# Patient Record
Sex: Male | Born: 1985
Health system: Southern US, Community
[De-identification: ages and names within clinical notes are randomized; demographics above are authoritative.]

## PROBLEM LIST (undated history)

## (undated) DIAGNOSIS — S52501A Unspecified fracture of the lower end of right radius, initial encounter for closed fracture: Secondary | ICD-10-CM

## (undated) DIAGNOSIS — S32301A Unspecified fracture of right ilium, initial encounter for closed fracture: Secondary | ICD-10-CM

## (undated) DIAGNOSIS — S52601A Unspecified fracture of lower end of right ulna, initial encounter for closed fracture: Secondary | ICD-10-CM

## (undated) HISTORY — PX: NO PAST SURGERIES: SHX2092

---

## 2018-02-12 ENCOUNTER — Inpatient Hospital Stay (HOSPITAL_COMMUNITY)
Admission: EM | Admit: 2018-02-12 | Discharge: 2018-02-16 | DRG: 958 | Disposition: A | Discharge status: Home / Self Care | Payer: Self-pay | Attending: General Surgery | Admitting: General Surgery

## 2018-02-12 ENCOUNTER — Emergency Department (HOSPITAL_COMMUNITY): Payer: Self-pay

## 2018-02-12 ENCOUNTER — Encounter (HOSPITAL_COMMUNITY): Payer: Self-pay | Admitting: Oncology

## 2018-02-12 DIAGNOSIS — R740 Nonspecific elevation of levels of transaminase and lactic acid dehydrogenase [LDH]: Secondary | ICD-10-CM | POA: Diagnosis present

## 2018-02-12 DIAGNOSIS — S52611A Displaced fracture of right ulna styloid process, initial encounter for closed fracture: Secondary | ICD-10-CM | POA: Diagnosis present

## 2018-02-12 DIAGNOSIS — F17200 Nicotine dependence, unspecified, uncomplicated: Secondary | ICD-10-CM | POA: Diagnosis present

## 2018-02-12 DIAGNOSIS — S32301A Unspecified fracture of right ilium, initial encounter for closed fracture: Principal | ICD-10-CM | POA: Diagnosis present

## 2018-02-12 DIAGNOSIS — F17210 Nicotine dependence, cigarettes, uncomplicated: Secondary | ICD-10-CM | POA: Diagnosis present

## 2018-02-12 DIAGNOSIS — S52601A Unspecified fracture of lower end of right ulna, initial encounter for closed fracture: Secondary | ICD-10-CM

## 2018-02-12 DIAGNOSIS — T148XXA Other injury of unspecified body region, initial encounter: Secondary | ICD-10-CM

## 2018-02-12 DIAGNOSIS — J939 Pneumothorax, unspecified: Secondary | ICD-10-CM | POA: Diagnosis present

## 2018-02-12 DIAGNOSIS — Z419 Encounter for procedure for purposes other than remedying health state, unspecified: Secondary | ICD-10-CM

## 2018-02-12 DIAGNOSIS — Y902 Blood alcohol level of 40-59 mg/100 ml: Secondary | ICD-10-CM | POA: Diagnosis present

## 2018-02-12 DIAGNOSIS — S82831A Other fracture of upper and lower end of right fibula, initial encounter for closed fracture: Secondary | ICD-10-CM | POA: Diagnosis present

## 2018-02-12 DIAGNOSIS — F10129 Alcohol abuse with intoxication, unspecified: Secondary | ICD-10-CM | POA: Diagnosis present

## 2018-02-12 DIAGNOSIS — S270XXA Traumatic pneumothorax, initial encounter: Secondary | ICD-10-CM | POA: Diagnosis present

## 2018-02-12 DIAGNOSIS — R52 Pain, unspecified: Secondary | ICD-10-CM

## 2018-02-12 DIAGNOSIS — S329XXA Fracture of unspecified parts of lumbosacral spine and pelvis, initial encounter for closed fracture: Secondary | ICD-10-CM

## 2018-02-12 DIAGNOSIS — S52501A Unspecified fracture of the lower end of right radius, initial encounter for closed fracture: Secondary | ICD-10-CM | POA: Diagnosis present

## 2018-02-12 HISTORY — DX: Unspecified fracture of the lower end of right radius, initial encounter for closed fracture: S52.501A

## 2018-02-12 HISTORY — DX: Unspecified fracture of lower end of right ulna, initial encounter for closed fracture: S52.601A

## 2018-02-12 HISTORY — DX: Unspecified fracture of right ilium, initial encounter for closed fracture: S32.301A

## 2018-02-12 LAB — CBC
HCT: 40 % (ref 39.0–52.0)
HEMOGLOBIN: 13.5 g/dL (ref 13.0–17.0)
MCH: 31.3 pg (ref 26.0–34.0)
MCHC: 33.8 g/dL (ref 30.0–36.0)
MCV: 92.8 fL (ref 78.0–100.0)
Platelets: 332 10*3/uL (ref 150–400)
RBC: 4.31 MIL/uL (ref 4.22–5.81)
RDW: 13.1 % (ref 11.5–15.5)
WBC: 9.5 10*3/uL (ref 4.0–10.5)

## 2018-02-12 LAB — I-STAT CHEM 8, ED
BUN: 11 mg/dL (ref 6–20)
CALCIUM ION: 1.08 mmol/L — AB (ref 1.15–1.40)
CHLORIDE: 107 mmol/L (ref 101–111)
Creatinine, Ser: 1 mg/dL (ref 0.61–1.24)
GLUCOSE: 134 mg/dL — AB (ref 65–99)
HCT: 41 % (ref 39.0–52.0)
Hemoglobin: 13.9 g/dL (ref 13.0–17.0)
Potassium: 3.6 mmol/L (ref 3.5–5.1)
Sodium: 142 mmol/L (ref 135–145)
TCO2: 22 mmol/L (ref 22–32)

## 2018-02-12 LAB — COMPREHENSIVE METABOLIC PANEL
ALK PHOS: 78 U/L (ref 38–126)
ALT: 134 U/L — ABNORMAL HIGH (ref 17–63)
ANION GAP: 12 (ref 5–15)
AST: 359 U/L — ABNORMAL HIGH (ref 15–41)
Albumin: 3.6 g/dL (ref 3.5–5.0)
BILIRUBIN TOTAL: 0.6 mg/dL (ref 0.3–1.2)
BUN: 10 mg/dL (ref 6–20)
CALCIUM: 8.5 mg/dL — AB (ref 8.9–10.3)
CO2: 19 mmol/L — ABNORMAL LOW (ref 22–32)
Chloride: 107 mmol/L (ref 101–111)
Creatinine, Ser: 0.96 mg/dL (ref 0.61–1.24)
GFR calc Af Amer: >60 mL/min (ref >60)
GFR calc non Af Amer: >60 mL/min (ref >60)
GLUCOSE: 134 mg/dL — AB (ref 65–99)
Potassium: 3.6 mmol/L (ref 3.5–5.1)
Sodium: 138 mmol/L (ref 135–145)
TOTAL PROTEIN: 6.8 g/dL (ref 6.5–8.1)

## 2018-02-12 LAB — I-STAT CG4 LACTIC ACID, ED: Lactic Acid, Venous: 3.67 mmol/L (ref 0.5–1.9)

## 2018-02-12 LAB — ETHANOL: ALCOHOL ETHYL (B): 42 mg/dL — AB (ref <10)

## 2018-02-12 LAB — CDS SEROLOGY

## 2018-02-12 LAB — PROTIME-INR
INR: 1.05
PROTHROMBIN TIME: 13.6 s (ref 11.4–15.2)

## 2018-02-12 LAB — SAMPLE TO BLOOD BANK

## 2018-02-12 MED ORDER — SODIUM CHLORIDE 0.9 % IV BOLUS
1000.0000 mL | Freq: Once | INTRAVENOUS | Status: AC
  Administered 2018-02-12: 1000 mL via INTRAVENOUS

## 2018-02-12 MED ORDER — TETANUS-DIPHTH-ACELL PERTUSSIS 5-2.5-18.5 LF-MCG/0.5 IM SUSP
0.5000 mL | Freq: Once | INTRAMUSCULAR | Status: AC
  Administered 2018-02-12: 0.5 mL via INTRAMUSCULAR
  Filled 2018-02-12: qty 0.5

## 2018-02-12 MED ORDER — CEFAZOLIN SODIUM-DEXTROSE 1-4 GM/50ML-% IV SOLN
1.0000 g | Freq: Once | INTRAVENOUS | Status: AC
  Administered 2018-02-12: 1 g via INTRAVENOUS
  Filled 2018-02-12: qty 50

## 2018-02-12 MED ORDER — SODIUM CHLORIDE 0.9 % IV SOLN
INTRAVENOUS | Status: DC
  Administered 2018-02-13: via INTRAVENOUS

## 2018-02-12 MED ORDER — FENTANYL CITRATE (PF) 100 MCG/2ML IJ SOLN
75.0000 ug | Freq: Once | INTRAMUSCULAR | Status: AC
  Administered 2018-02-12: 75 ug via INTRAVENOUS
  Filled 2018-02-12: qty 2

## 2018-02-12 MED ORDER — IOHEXOL 300 MG/ML  SOLN
100.0000 mL | Freq: Once | INTRAMUSCULAR | Status: AC | PRN
  Administered 2018-02-12: 100 mL via INTRAVENOUS
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
## 2018-02-12 NOTE — ED Notes (Signed)
Patient transported to CT 

## 2018-02-13 ENCOUNTER — Emergency Department (HOSPITAL_COMMUNITY): Payer: Self-pay

## 2018-02-13 ENCOUNTER — Encounter (HOSPITAL_COMMUNITY): Payer: Self-pay | Admitting: General Practice

## 2018-02-13 ENCOUNTER — Other Ambulatory Visit: Payer: Self-pay

## 2018-02-13 ENCOUNTER — Inpatient Hospital Stay (HOSPITAL_COMMUNITY): Payer: Self-pay

## 2018-02-13 LAB — CBC
HCT: 36.5 % — ABNORMAL LOW (ref 39.0–52.0)
Hemoglobin: 12.2 g/dL — ABNORMAL LOW (ref 13.0–17.0)
MCH: 31.4 pg (ref 26.0–34.0)
MCHC: 33.4 g/dL (ref 30.0–36.0)
MCV: 93.8 fL (ref 78.0–100.0)
PLATELETS: 279 10*3/uL (ref 150–400)
RBC: 3.89 MIL/uL — AB (ref 4.22–5.81)
RDW: 13.3 % (ref 11.5–15.5)
WBC: 13.8 10*3/uL — AB (ref 4.0–10.5)

## 2018-02-13 LAB — URINALYSIS, ROUTINE W REFLEX MICROSCOPIC
Bilirubin Urine: NEGATIVE
Glucose, UA: NEGATIVE mg/dL
Ketones, ur: NEGATIVE mg/dL
Leukocytes, UA: NEGATIVE
Nitrite: NEGATIVE
Protein, ur: NEGATIVE mg/dL
Specific Gravity, Urine: 1.025 (ref 1.005–1.030)
pH: 5 (ref 5.0–8.0)

## 2018-02-13 LAB — COMPREHENSIVE METABOLIC PANEL
ALT: 121 U/L — AB (ref 17–63)
AST: 294 U/L — ABNORMAL HIGH (ref 15–41)
Albumin: 3.5 g/dL (ref 3.5–5.0)
Alkaline Phosphatase: 79 U/L (ref 38–126)
Anion gap: 7 (ref 5–15)
BILIRUBIN TOTAL: 0.4 mg/dL (ref 0.3–1.2)
BUN: 8 mg/dL (ref 6–20)
CO2: 24 mmol/L (ref 22–32)
Calcium: 8.3 mg/dL — ABNORMAL LOW (ref 8.9–10.3)
Chloride: 108 mmol/L (ref 101–111)
Creatinine, Ser: 0.84 mg/dL (ref 0.61–1.24)
GFR calc Af Amer: >60 mL/min (ref >60)
Glucose, Bld: 126 mg/dL — ABNORMAL HIGH (ref 65–99)
POTASSIUM: 4.1 mmol/L (ref 3.5–5.1)
SODIUM: 139 mmol/L (ref 135–145)
TOTAL PROTEIN: 6.6 g/dL (ref 6.5–8.1)

## 2018-02-13 MED ORDER — ONDANSETRON HCL 4 MG/2ML IJ SOLN
4.0000 mg | Freq: Four times a day (QID) | INTRAMUSCULAR | Status: DC | PRN
  Administered 2018-02-14: 4 mg via INTRAVENOUS

## 2018-02-13 MED ORDER — OXYCODONE HCL 5 MG PO TABS: 5.0000 mg | ORAL_TABLET | ORAL | Status: DC | PRN

## 2018-02-13 MED ORDER — MORPHINE SULFATE (PF) 4 MG/ML IV SOLN
4.0000 mg | Freq: Once | INTRAVENOUS | Status: AC
  Administered 2018-02-13: 4 mg via INTRAVENOUS
  Filled 2018-02-13: qty 1

## 2018-02-13 MED ORDER — ONDANSETRON 4 MG PO TBDP: 4.0000 mg | ORAL_TABLET | Freq: Four times a day (QID) | ORAL | Status: DC | PRN

## 2018-02-13 MED ORDER — MORPHINE SULFATE (PF) 4 MG/ML IV SOLN: 1.0000 mg | INTRAVENOUS | Status: DC | PRN

## 2018-02-13 MED ORDER — VITAMIN B-1 100 MG PO TABS
100.0000 mg | ORAL_TABLET | Freq: Every day | ORAL | Status: DC
  Administered 2018-02-13 – 2018-02-16 (×4): 100 mg via ORAL
  Filled 2018-02-13 (×4): qty 1

## 2018-02-13 MED ORDER — PROMETHAZINE HCL 25 MG/ML IJ SOLN: 12.5000 mg | Freq: Four times a day (QID) | INTRAMUSCULAR | Status: DC | PRN

## 2018-02-13 MED ORDER — ADULT MULTIVITAMIN W/MINERALS CH
1.0000 | ORAL_TABLET | Freq: Every day | ORAL | Status: DC
  Administered 2018-02-13 – 2018-02-16 (×4): 1 via ORAL
  Filled 2018-02-13 (×4): qty 1

## 2018-02-13 MED ORDER — DOCUSATE SODIUM 100 MG PO CAPS
100.0000 mg | ORAL_CAPSULE | Freq: Two times a day (BID) | ORAL | Status: DC
  Administered 2018-02-13 – 2018-02-14 (×2): 100 mg via ORAL
  Filled 2018-02-13 (×3): qty 1

## 2018-02-13 MED ORDER — KETAMINE HCL 10 MG/ML IJ SOLN
0.3000 mg/kg | Freq: Once | INTRAMUSCULAR | Status: AC
  Administered 2018-02-13: 19 mg via INTRAVENOUS
  Filled 2018-02-13: qty 1

## 2018-02-13 MED ORDER — HYDROMORPHONE HCL 2 MG/ML IJ SOLN
1.0000 mg | INTRAMUSCULAR | Status: DC | PRN
  Administered 2018-02-13 – 2018-02-14 (×4): 1 mg via INTRAVENOUS
  Filled 2018-02-13 (×4): qty 1

## 2018-02-13 MED ORDER — FOLIC ACID 1 MG PO TABS
1.0000 mg | ORAL_TABLET | Freq: Every day | ORAL | Status: DC
  Administered 2018-02-13 – 2018-02-16 (×4): 1 mg via ORAL
  Filled 2018-02-13 (×4): qty 1

## 2018-02-13 MED ORDER — LORAZEPAM 1 MG PO TABS: 1.0000 mg | ORAL_TABLET | Freq: Four times a day (QID) | ORAL | Status: AC | PRN

## 2018-02-13 MED ORDER — ACETAMINOPHEN 325 MG PO TABS: 650.0000 mg | ORAL_TABLET | ORAL | Status: DC | PRN

## 2018-02-13 MED ORDER — LORAZEPAM 2 MG/ML IJ SOLN: 1.0000 mg | Freq: Four times a day (QID) | INTRAMUSCULAR | Status: AC | PRN

## 2018-02-13 MED ORDER — OXYCODONE HCL 5 MG PO TABS
10.0000 mg | ORAL_TABLET | ORAL | Status: DC | PRN
  Administered 2018-02-13 – 2018-02-15 (×4): 10 mg via ORAL
  Filled 2018-02-13 (×4): qty 2

## 2018-02-13 MED ORDER — ENOXAPARIN SODIUM 40 MG/0.4ML ~~LOC~~ SOLN
40.0000 mg | SUBCUTANEOUS | Status: AC
  Administered 2018-02-13: 40 mg via SUBCUTANEOUS
  Filled 2018-02-13 (×2): qty 0.4

## 2018-02-13 MED ORDER — POTASSIUM CHLORIDE IN NACL 20-0.9 MEQ/L-% IV SOLN
INTRAVENOUS | Status: DC
  Administered 2018-02-13 – 2018-02-16 (×4): via INTRAVENOUS
  Filled 2018-02-13 (×7): qty 1000

## 2018-02-13 MED ORDER — THIAMINE HCL 100 MG/ML IJ SOLN
100.0000 mg | Freq: Every day | INTRAMUSCULAR | Status: DC
  Filled 2018-02-13: qty 2

## 2018-02-13 MED ORDER — LIDOCAINE HCL (PF) 1 % IJ SOLN
INTRAMUSCULAR | Status: AC
  Filled 2018-02-13: qty 30
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
## 2018-02-13 NOTE — Progress Notes (Signed)
Pt arrived via stretcher. Alert and oriented, acquainted to room with family member at bedside. Patient quietly resting. Will continue to monitor
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
## 2018-02-14 ENCOUNTER — Inpatient Hospital Stay (HOSPITAL_COMMUNITY): Payer: Self-pay

## 2018-02-14 ENCOUNTER — Inpatient Hospital Stay (HOSPITAL_COMMUNITY): Payer: Self-pay | Admitting: Anesthesiology

## 2018-02-14 ENCOUNTER — Encounter (HOSPITAL_COMMUNITY): Admission: EM | Disposition: A | Discharge status: Home / Self Care | Payer: Self-pay | Source: Home / Self Care

## 2018-02-14 HISTORY — PX: ORIF WRIST FRACTURE: SHX2133

## 2018-02-14 HISTORY — PX: ORIF PELVIC FRACTURE: SHX2128

## 2018-02-14 LAB — CBC
HCT: 38.4 % — ABNORMAL LOW (ref 39.0–52.0)
Hemoglobin: 12.6 g/dL — ABNORMAL LOW (ref 13.0–17.0)
MCH: 31.1 pg (ref 26.0–34.0)
MCHC: 32.8 g/dL (ref 30.0–36.0)
MCV: 94.8 fL (ref 78.0–100.0)
PLATELETS: 226 10*3/uL (ref 150–400)
RBC: 4.05 MIL/uL — ABNORMAL LOW (ref 4.22–5.81)
RDW: 13.2 % (ref 11.5–15.5)
WBC: 7.3 10*3/uL (ref 4.0–10.5)

## 2018-02-14 LAB — COMPREHENSIVE METABOLIC PANEL
ALK PHOS: 59 U/L (ref 38–126)
ALT: 71 U/L — AB (ref 17–63)
AST: 63 U/L — ABNORMAL HIGH (ref 15–41)
Albumin: 3.2 g/dL — ABNORMAL LOW (ref 3.5–5.0)
Anion gap: 5 (ref 5–15)
BUN: <5 mg/dL — ABNORMAL LOW (ref 6–20)
CALCIUM: 8.8 mg/dL — AB (ref 8.9–10.3)
CO2: 26 mmol/L (ref 22–32)
CREATININE: 0.69 mg/dL (ref 0.61–1.24)
Chloride: 103 mmol/L (ref 101–111)
GFR calc non Af Amer: >60 mL/min (ref >60)
Glucose, Bld: 96 mg/dL (ref 65–99)
Potassium: 4.1 mmol/L (ref 3.5–5.1)
Sodium: 134 mmol/L — ABNORMAL LOW (ref 135–145)
Total Bilirubin: 0.8 mg/dL (ref 0.3–1.2)
Total Protein: 6.5 g/dL (ref 6.5–8.1)

## 2018-02-14 LAB — SURGICAL PCR SCREEN
MRSA, PCR: NEGATIVE
STAPHYLOCOCCUS AUREUS: NEGATIVE

## 2018-02-14 LAB — TYPE AND SCREEN
ABO/RH(D): B NEG
Antibody Screen: NEGATIVE

## 2018-02-14 LAB — PROTIME-INR
INR: 0.97
PROTHROMBIN TIME: 12.7 s (ref 11.4–15.2)

## 2018-02-14 LAB — ABO/RH: ABO/RH(D): B NEG

## 2018-02-14 LAB — HIV ANTIBODY (ROUTINE TESTING W REFLEX): HIV SCREEN 4TH GENERATION: NONREACTIVE

## 2018-02-14 LAB — LACTIC ACID, PLASMA: LACTIC ACID, VENOUS: 0.9 mmol/L (ref 0.5–1.9)

## 2018-02-14 SURGERY — OPEN REDUCTION INTERNAL FIXATION (ORIF) PELVIC FRACTURE
Anesthesia: General | Site: Wrist | Laterality: Right

## 2018-02-14 MED ORDER — LIDOCAINE 2% (20 MG/ML) 5 ML SYRINGE
INTRAMUSCULAR | Status: AC
  Filled 2018-02-14: qty 5

## 2018-02-14 MED ORDER — SUGAMMADEX SODIUM 200 MG/2ML IV SOLN
INTRAVENOUS | Status: AC
  Filled 2018-02-14: qty 2

## 2018-02-14 MED ORDER — HYDROMORPHONE HCL 2 MG/ML IJ SOLN
0.2500 mg | INTRAMUSCULAR | Status: DC | PRN
  Administered 2018-02-14: 0.5 mg via INTRAVENOUS

## 2018-02-14 MED ORDER — SUGAMMADEX SODIUM 200 MG/2ML IV SOLN
INTRAVENOUS | Status: DC | PRN
  Administered 2018-02-14: 150 mg via INTRAVENOUS

## 2018-02-14 MED ORDER — LACTATED RINGERS IV SOLN
Freq: Once | INTRAVENOUS | Status: AC
  Administered 2018-02-14 (×3): via INTRAVENOUS

## 2018-02-14 MED ORDER — FENTANYL CITRATE (PF) 250 MCG/5ML IJ SOLN
INTRAMUSCULAR | Status: AC
  Filled 2018-02-14: qty 5

## 2018-02-14 MED ORDER — DEXAMETHASONE SODIUM PHOSPHATE 10 MG/ML IJ SOLN
INTRAMUSCULAR | Status: AC
  Filled 2018-02-14: qty 1

## 2018-02-14 MED ORDER — CEFAZOLIN SODIUM-DEXTROSE 2-4 GM/100ML-% IV SOLN
2.0000 g | INTRAVENOUS | Status: AC
  Administered 2018-02-14: 2 g via INTRAVENOUS
  Filled 2018-02-14: qty 100

## 2018-02-14 MED ORDER — MIDAZOLAM HCL 2 MG/2ML IJ SOLN
INTRAMUSCULAR | Status: AC
  Filled 2018-02-14: qty 2

## 2018-02-14 MED ORDER — PROPOFOL 10 MG/ML IV BOLUS
INTRAVENOUS | Status: DC | PRN
  Administered 2018-02-14: 200 mg via INTRAVENOUS

## 2018-02-14 MED ORDER — HYDROMORPHONE HCL 2 MG/ML IJ SOLN
INTRAMUSCULAR | Status: AC
  Filled 2018-02-14: qty 1

## 2018-02-14 MED ORDER — MIDAZOLAM HCL 2 MG/2ML IJ SOLN
INTRAMUSCULAR | Status: DC | PRN
  Administered 2018-02-14: 2 mg via INTRAVENOUS

## 2018-02-14 MED ORDER — ROCURONIUM BROMIDE 10 MG/ML (PF) SYRINGE
PREFILLED_SYRINGE | INTRAVENOUS | Status: AC
  Filled 2018-02-14: qty 5

## 2018-02-14 MED ORDER — 0.9 % SODIUM CHLORIDE (POUR BTL) OPTIME
TOPICAL | Status: DC | PRN
  Administered 2018-02-14: 1000 mL

## 2018-02-14 MED ORDER — ROCURONIUM BROMIDE 100 MG/10ML IV SOLN
INTRAVENOUS | Status: DC | PRN
  Administered 2018-02-14: 70 mg via INTRAVENOUS
  Administered 2018-02-14: 10 mg via INTRAVENOUS

## 2018-02-14 MED ORDER — MUPIROCIN 2 % EX OINT
1.0000 "application " | TOPICAL_OINTMENT | Freq: Two times a day (BID) | CUTANEOUS | Status: AC
  Administered 2018-02-14 – 2018-02-15 (×3): 1 via NASAL
  Filled 2018-02-14: qty 22

## 2018-02-14 MED ORDER — LIDOCAINE HCL (CARDIAC) PF 100 MG/5ML IV SOSY
PREFILLED_SYRINGE | INTRAVENOUS | Status: DC | PRN
  Administered 2018-02-14: 100 mg via INTRATRACHEAL

## 2018-02-14 MED ORDER — FENTANYL CITRATE (PF) 250 MCG/5ML IJ SOLN
INTRAMUSCULAR | Status: DC | PRN
  Administered 2018-02-14: 100 ug via INTRAVENOUS
  Administered 2018-02-14: 50 ug via INTRAVENOUS
  Administered 2018-02-14 (×2): 100 ug via INTRAVENOUS
  Administered 2018-02-14 (×3): 50 ug via INTRAVENOUS

## 2018-02-14 MED ORDER — DEXAMETHASONE SODIUM PHOSPHATE 10 MG/ML IJ SOLN
INTRAMUSCULAR | Status: DC | PRN
  Administered 2018-02-14: 10 mg via INTRAVENOUS

## 2018-02-14 MED ORDER — PROPOFOL 10 MG/ML IV BOLUS
INTRAVENOUS | Status: AC
  Filled 2018-02-14: qty 20

## 2018-02-14 MED ORDER — ONDANSETRON HCL 4 MG/2ML IJ SOLN
INTRAMUSCULAR | Status: AC
  Filled 2018-02-14: qty 2

## 2018-02-14 SURGICAL SUPPLY — 105 items
BANDAGE ACE 3X5.8 VEL STRL LF (GAUZE/BANDAGES/DRESSINGS) ×4 IMPLANT
BANDAGE ACE 4X5 VEL STRL LF (GAUZE/BANDAGES/DRESSINGS) ×4 IMPLANT
BENZOIN TINCTURE PRP APPL 2/3 (GAUZE/BANDAGES/DRESSINGS) ×8 IMPLANT
BIT DRILL 2.2 SS TIBIAL (BIT) ×4 IMPLANT
BLADE CLIPPER SURG (BLADE) ×4 IMPLANT
BNDG ELASTIC 2X5.8 VLCR STR LF (GAUZE/BANDAGES/DRESSINGS) ×4 IMPLANT
BNDG ESMARK 4X9 LF (GAUZE/BANDAGES/DRESSINGS) ×4 IMPLANT
BNDG GAUZE ELAST 4 BULKY (GAUZE/BANDAGES/DRESSINGS) ×4 IMPLANT
BRUSH SCRUB SURG 4.25 DISP (MISCELLANEOUS) ×8 IMPLANT
CAP PIN PROTECTOR ORTHO WHT (CAP) ×4 IMPLANT
CLOSURE WOUND 1/2 X4 (GAUZE/BANDAGES/DRESSINGS) ×1
CORD BIPOLAR FORCEPS 12FT (ELECTRODE) ×4 IMPLANT
COVER SURGICAL LIGHT HANDLE (MISCELLANEOUS) ×8 IMPLANT
DECANTER SPIKE VIAL GLASS SM (MISCELLANEOUS) IMPLANT
DRAIN CHANNEL 15F RND FF W/TCR (WOUND CARE) IMPLANT
DRAPE C-ARM 42X72 X-RAY (DRAPES) ×4 IMPLANT
DRAPE C-ARMOR (DRAPES) ×4 IMPLANT
DRAPE LAPAROTOMY TRNSV 102X78 (DRAPE) ×4 IMPLANT
DRAPE OEC MINIVIEW 54X84 (DRAPES) ×4 IMPLANT
DRAPE ORTHO SPLIT 77X108 STRL (DRAPES) ×2
DRAPE SURG 17X23 STRL (DRAPES) ×4 IMPLANT
DRAPE SURG ORHT 6 SPLT 77X108 (DRAPES) ×2 IMPLANT
DRAPE U-SHAPE 47X51 STRL (DRAPES) ×4 IMPLANT
DRSG ADAPTIC 3X8 NADH LF (GAUZE/BANDAGES/DRESSINGS) ×4 IMPLANT
DRSG EMULSION OIL 3X3 NADH (GAUZE/BANDAGES/DRESSINGS) ×4 IMPLANT
DRSG MEPILEX BORDER 4X8 (GAUZE/BANDAGES/DRESSINGS) ×4 IMPLANT
ELECT BLADE 4.0 EZ CLEAN MEGAD (MISCELLANEOUS) ×4
ELECT REM PT RETURN 9FT ADLT (ELECTROSURGICAL) ×4
ELECTRODE BLDE 4.0 EZ CLN MEGD (MISCELLANEOUS) ×2 IMPLANT
ELECTRODE REM PT RTRN 9FT ADLT (ELECTROSURGICAL) ×2 IMPLANT
EVACUATOR SILICONE 100CC (DRAIN) IMPLANT
GAUZE SPONGE 4X4 12PLY STRL (GAUZE/BANDAGES/DRESSINGS) ×4 IMPLANT
GLOVE BIO SURGEON STRL SZ7.5 (GLOVE) ×4 IMPLANT
GLOVE BIO SURGEON STRL SZ8 (GLOVE) ×4 IMPLANT
GLOVE BIOGEL PI IND STRL 6.5 (GLOVE) ×2 IMPLANT
GLOVE BIOGEL PI IND STRL 7.5 (GLOVE) ×2 IMPLANT
GLOVE BIOGEL PI IND STRL 8 (GLOVE) ×2 IMPLANT
GLOVE BIOGEL PI INDICATOR 6.5 (GLOVE) ×2
GLOVE BIOGEL PI INDICATOR 7.5 (GLOVE) ×2
GLOVE BIOGEL PI INDICATOR 8 (GLOVE) ×2
GLOVE SURG SS PI 6.0 STRL IVOR (GLOVE) ×4 IMPLANT
GOWN STRL REUS W/ TWL LRG LVL3 (GOWN DISPOSABLE) ×4 IMPLANT
GOWN STRL REUS W/ TWL XL LVL3 (GOWN DISPOSABLE) ×2 IMPLANT
GOWN STRL REUS W/TWL LRG LVL3 (GOWN DISPOSABLE) ×4
GOWN STRL REUS W/TWL XL LVL3 (GOWN DISPOSABLE) ×2
K-WIRE 1.6 (WIRE) ×10
K-WIRE DBL TROCAR .045X4 ×4 IMPLANT
K-WIRE FX5X1.6XNS BN SS (WIRE) ×10
KIT BASIN OR (CUSTOM PROCEDURE TRAY) ×4 IMPLANT
KIT TURNOVER KIT B (KITS) ×4 IMPLANT
KWIRE DBL TROCAR .045X4 ×2 IMPLANT
KWIRE FX5X1.6XNS BN SS (WIRE) ×10 IMPLANT
MANIFOLD NEPTUNE II (INSTRUMENTS) ×4 IMPLANT
NEEDLE HYPO 25GX1X1/2 BEV (NEEDLE) IMPLANT
NS IRRIG 1000ML POUR BTL (IV SOLUTION) ×8 IMPLANT
PACK ORTHO EXTREMITY (CUSTOM PROCEDURE TRAY) ×4 IMPLANT
PACK TOTAL JOINT (CUSTOM PROCEDURE TRAY) ×4 IMPLANT
PAD ARMBOARD 7.5X6 YLW CONV (MISCELLANEOUS) ×8 IMPLANT
PAD CAST 3X4 CTTN HI CHSV (CAST SUPPLIES) ×2 IMPLANT
PADDING CAST COTTON 3X4 STRL (CAST SUPPLIES) ×2
PADDING UNDERCAST 2 STRL (CAST SUPPLIES) ×2
PADDING UNDERCAST 2X4 STRL (CAST SUPPLIES) ×2 IMPLANT
PEG LOCKING SMOOTH 2.2X20 (Screw) ×8 IMPLANT
PEG LOCKING SMOOTH 2.2X22 (Screw) ×12 IMPLANT
PEG LOCKING SMOOTH 2.2X24 (Peg) ×4 IMPLANT
PLATE ACET STRT 94.5M 8H (Plate) ×4 IMPLANT
PLATE DVR CROSSLOCK STD RT (Plate) ×4 IMPLANT
PLATE PROXIMAL ACULOC 2 WD RT (Plate) ×4 IMPLANT
SCREW CORTEX ST MATTA 3.5X18MM (Screw) ×4 IMPLANT
SCREW CORTEX ST MATTA 3.5X20 (Screw) ×4 IMPLANT
SCREW CORTEX ST MATTA 3.5X24 (Screw) ×4 IMPLANT
SCREW CORTEX ST MATTA 3.5X30MM (Screw) ×4 IMPLANT
SCREW CORTEX ST MATTA 3.5X50MM (Screw) ×4 IMPLANT
SCREW LOCK 16X2.7X 3 LD TPR (Screw) ×4 IMPLANT
SCREW LOCKING 2.7X15MM (Screw) ×4 IMPLANT
SCREW LOCKING 2.7X16 (Screw) ×4 IMPLANT
SCREW MULTI DIRECTIONAL 2.7X22 (Screw) ×4 IMPLANT
SCREW NLOCK 24X2.7 3 LD (Screw) ×2 IMPLANT
SCREW NONLOCK 2.7X24 (Screw) ×2 IMPLANT
SPLINT PLASTER CAST XFAST 4X15 (CAST SUPPLIES) ×2 IMPLANT
SPLINT PLASTER XTRA FAST SET 4 (CAST SUPPLIES) ×2
SPONGE LAP 18X18 X RAY DECT (DISPOSABLE) IMPLANT
STAPLER VISISTAT 35W (STAPLE) ×4 IMPLANT
STRIP CLOSURE SKIN 1/2X4 (GAUZE/BANDAGES/DRESSINGS) ×3 IMPLANT
SUCTION FRAZIER HANDLE 10FR (MISCELLANEOUS) ×2
SUCTION TUBE FRAZIER 10FR DISP (MISCELLANEOUS) ×2 IMPLANT
SUT ETHILON 3 0 PS 1 (SUTURE) ×4 IMPLANT
SUT MNCRL AB 3-0 PS2 18 (SUTURE) ×4 IMPLANT
SUT PROLENE 0 CT (SUTURE) IMPLANT
SUT VIC AB 0 CT1 27 (SUTURE) ×4
SUT VIC AB 0 CT1 27XBRD ANBCTR (SUTURE) ×4 IMPLANT
SUT VIC AB 1 CT1 18XCR BRD 8 (SUTURE) ×4 IMPLANT
SUT VIC AB 1 CT1 8-18 (SUTURE) ×4
SUT VIC AB 2-0 CT1 27 (SUTURE) ×6
SUT VIC AB 2-0 CT1 TAPERPNT 27 (SUTURE) ×6 IMPLANT
SUT VIC AB 2-0 CT3 27 (SUTURE) IMPLANT
SYR CONTROL 10ML LL (SYRINGE) IMPLANT
TOWEL OR 17X24 6PK STRL BLUE (TOWEL DISPOSABLE) ×4 IMPLANT
TOWEL OR 17X26 10 PK STRL BLUE (TOWEL DISPOSABLE) ×8 IMPLANT
TRAY FOLEY MTR SLVR 16FR STAT (SET/KITS/TRAYS/PACK) IMPLANT
TUBE CONNECTING 12'X1/4 (SUCTIONS) ×1
TUBE CONNECTING 12X1/4 (SUCTIONS) ×3 IMPLANT
UNDERPAD 30X30 (UNDERPADS AND DIAPERS) ×4 IMPLANT
WATER STERILE IRR 1000ML POUR (IV SOLUTION) ×16 IMPLANT
k wire 1.6 mm ×20 IMPLANT
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
## 2018-02-14 NOTE — Transfer of Care (Signed)
Immediate Anesthesia Transfer of Care Note  Patient: Gregory Patterson  Procedure(s) Performed: OPEN REDUCTION INTERNAL FIXATION (ORIF) PELVIC FRACTURE (Right Pelvis) OPEN REDUCTION INTERNAL FIXATION (ORIF) DISTAL RADIUS FRACTURE (Right Wrist)  Patient Location: PACU  Anesthesia Type:General  Level of Consciousness: sedated and patient cooperative  Airway & Oxygen Therapy: Patient connected to face mask oxygen  Post-op Assessment: Report given to RN and Post -op Vital signs reviewed and stable  Post vital signs: Reviewed and stable  Last Vitals:  Vitals Value Taken Time  BP 170/100 02/14/2018 10:32 PM  Temp    Pulse 103 02/14/2018 10:38 PM  Resp 16 02/14/2018 10:38 PM  SpO2 99 % 02/14/2018 10:38 PM  Vitals shown include unvalidated device data.  Last Pain:  Vitals:   02/14/18 2232  TempSrc:   PainSc: (P) Asleep      Patients Stated Pain Goal: 2 (02/14/18 0981)  Complications: No apparent anesthesia complications
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
## 2018-02-14 NOTE — Anesthesia Preprocedure Evaluation (Addendum)
Anesthesia Evaluation  Patient identified by MRN, date of birth, ID band Patient awake    Reviewed: Allergy & Precautions, H&P , NPO status , Patient's Chart, lab work & pertinent test results  Airway Mallampati: II  TM Distance: >3 FB Neck ROM: Full    Dental no notable dental hx. (+) Teeth Intact, Dental Advisory Given   Pulmonary Current Smoker,    Pulmonary exam normal breath sounds clear to auscultation       Cardiovascular Exercise Tolerance: Good negative cardio ROS   Rhythm:Regular Rate:Normal     Neuro/Psych negative neurological ROS  negative psych ROS   GI/Hepatic negative GI ROS, (+)     substance abuse  marijuana use,   Endo/Other  negative endocrine ROS  Renal/GU negative Renal ROS  negative genitourinary   Musculoskeletal   Abdominal   Peds  Hematology negative hematology ROS (+)   Anesthesia Other Findings   Reproductive/Obstetrics negative OB ROS                            Anesthesia Physical Anesthesia Plan  ASA: II  Anesthesia Plan: General   Post-op Pain Management:    Induction: Intravenous  PONV Risk Score and Plan: 2 and Ondansetron, Dexamethasone and Midazolam  Airway Management Planned: Oral ETT  Additional Equipment:   Intra-op Plan:   Post-operative Plan: Extubation in OR  Informed Consent: I have reviewed the patients History and Physical, chart, labs and discussed the procedure including the risks, benefits and alternatives for the proposed anesthesia with the patient or authorized representative who has indicated his/her understanding and acceptance.   Dental advisory given  Plan Discussed with: CRNA  Anesthesia Plan Comments:        Anesthesia Quick Evaluation
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
## 2018-02-14 NOTE — Anesthesia Procedure Notes (Signed)
Procedure Name: Intubation Date/Time: 02/14/2018 7:02 PM Performed by: Molli Hazard, CRNA Pre-anesthesia Checklist: Patient identified, Emergency Drugs available, Suction available and Patient being monitored Patient Re-evaluated:Patient Re-evaluated prior to induction Oxygen Delivery Method: Circle system utilized Preoxygenation: Pre-oxygenation with 100% oxygen Induction Type: IV induction Ventilation: Mask ventilation without difficulty Laryngoscope Size: Miller and 2 Grade View: Grade I Tube type: Oral Tube size: 7.5 mm Number of attempts: 1 Airway Equipment and Method: Stylet Placement Confirmation: ETT inserted through vocal cords under direct vision,  positive ETCO2 and breath sounds checked- equal and bilateral Secured at: 21 cm Tube secured with: Tape Dental Injury: Teeth and Oropharynx as per pre-operative assessment

## 2018-02-14 NOTE — Progress Notes (Signed)
To O.R. via bed
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
## 2018-02-15 ENCOUNTER — Encounter (HOSPITAL_COMMUNITY): Payer: Self-pay | Admitting: Orthopedic Surgery

## 2018-02-15 LAB — BASIC METABOLIC PANEL
Anion gap: 8 (ref 5–15)
CALCIUM: 8.8 mg/dL — AB (ref 8.9–10.3)
CO2: 27 mmol/L (ref 22–32)
CREATININE: 0.77 mg/dL (ref 0.61–1.24)
Chloride: 100 mmol/L — ABNORMAL LOW (ref 101–111)
GFR calc Af Amer: >60 mL/min (ref >60)
GFR calc non Af Amer: >60 mL/min (ref >60)
GLUCOSE: 145 mg/dL — AB (ref 65–99)
Potassium: 4.2 mmol/L (ref 3.5–5.1)
Sodium: 135 mmol/L (ref 135–145)

## 2018-02-15 LAB — CBC
HCT: 36.2 % — ABNORMAL LOW (ref 39.0–52.0)
Hemoglobin: 12.3 g/dL — ABNORMAL LOW (ref 13.0–17.0)
MCH: 31.6 pg (ref 26.0–34.0)
MCHC: 34 g/dL (ref 30.0–36.0)
MCV: 93.1 fL (ref 78.0–100.0)
PLATELETS: 279 10*3/uL (ref 150–400)
RBC: 3.89 MIL/uL — ABNORMAL LOW (ref 4.22–5.81)
RDW: 12.9 % (ref 11.5–15.5)
WBC: 13.4 10*3/uL — ABNORMAL HIGH (ref 4.0–10.5)

## 2018-02-15 MED ORDER — POLYETHYLENE GLYCOL 3350 17 G PO PACK
17.0000 g | PACK | Freq: Every day | ORAL | Status: DC
  Administered 2018-02-15 – 2018-02-16 (×2): 17 g via ORAL
  Filled 2018-02-15 (×2): qty 1

## 2018-02-15 MED ORDER — ACETAMINOPHEN 325 MG PO TABS
650.0000 mg | ORAL_TABLET | ORAL | Status: DC
  Administered 2018-02-15 – 2018-02-16 (×9): 650 mg via ORAL
  Filled 2018-02-15 (×10): qty 2

## 2018-02-15 MED ORDER — ENOXAPARIN SODIUM 40 MG/0.4ML ~~LOC~~ SOLN
40.0000 mg | SUBCUTANEOUS | Status: DC
  Administered 2018-02-15 – 2018-02-16 (×2): 40 mg via SUBCUTANEOUS
  Filled 2018-02-15 (×2): qty 0.4

## 2018-02-15 MED ORDER — ONDANSETRON HCL 4 MG/2ML IJ SOLN: 4.0000 mg | Freq: Four times a day (QID) | INTRAMUSCULAR | Status: DC | PRN

## 2018-02-15 MED ORDER — ONDANSETRON HCL 4 MG PO TABS: 4.0000 mg | ORAL_TABLET | Freq: Four times a day (QID) | ORAL | Status: DC | PRN

## 2018-02-15 MED ORDER — HYDROMORPHONE HCL 2 MG/ML IJ SOLN
1.0000 mg | INTRAMUSCULAR | Status: DC | PRN
  Administered 2018-02-15: 2 mg via INTRAVENOUS
  Administered 2018-02-15: 1 mg via INTRAVENOUS
  Administered 2018-02-15 – 2018-02-16 (×2): 2 mg via INTRAVENOUS
  Filled 2018-02-15 (×4): qty 1

## 2018-02-15 MED ORDER — CEFAZOLIN SODIUM-DEXTROSE 1-4 GM/50ML-% IV SOLN
1.0000 g | Freq: Four times a day (QID) | INTRAVENOUS | Status: AC
  Administered 2018-02-15 (×3): 1 g via INTRAVENOUS
  Filled 2018-02-15 (×3): qty 50

## 2018-02-15 MED ORDER — METOCLOPRAMIDE HCL 5 MG PO TABS: 5.0000 mg | ORAL_TABLET | Freq: Three times a day (TID) | ORAL | Status: DC | PRN

## 2018-02-15 MED ORDER — DOCUSATE SODIUM 100 MG PO CAPS
100.0000 mg | ORAL_CAPSULE | Freq: Two times a day (BID) | ORAL | Status: DC
  Administered 2018-02-15 – 2018-02-16 (×4): 100 mg via ORAL
  Filled 2018-02-15 (×4): qty 1

## 2018-02-15 MED ORDER — OXYCODONE HCL 5 MG PO TABS
5.0000 mg | ORAL_TABLET | ORAL | Status: DC | PRN
  Administered 2018-02-16: 10 mg via ORAL
  Administered 2018-02-16: 5 mg via ORAL
  Filled 2018-02-15 (×2): qty 2
  Filled 2018-02-15: qty 1

## 2018-02-15 MED ORDER — METHOCARBAMOL 500 MG PO TABS
1000.0000 mg | ORAL_TABLET | Freq: Four times a day (QID) | ORAL | Status: DC
  Administered 2018-02-15 – 2018-02-16 (×7): 1000 mg via ORAL
  Filled 2018-02-15 (×7): qty 2

## 2018-02-15 MED ORDER — METOCLOPRAMIDE HCL 5 MG/ML IJ SOLN: 5.0000 mg | Freq: Three times a day (TID) | INTRAMUSCULAR | Status: DC | PRN
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
## 2018-02-15 NOTE — Anesthesia Postprocedure Evaluation (Signed)
Anesthesia Post Note  Patient: Gregory Patterson  Procedure(s) Performed: OPEN REDUCTION INTERNAL FIXATION (ORIF) PELVIC FRACTURE (Right Pelvis) OPEN REDUCTION INTERNAL FIXATION (ORIF) DISTAL RADIUS FRACTURE (Right Wrist)     Patient location during evaluation: PACU Anesthesia Type: General Level of consciousness: awake and alert Pain management: pain level controlled Vital Signs Assessment: post-procedure vital signs reviewed and stable Respiratory status: spontaneous breathing, nonlabored ventilation and respiratory function stable Cardiovascular status: blood pressure returned to baseline and stable Postop Assessment: no apparent nausea or vomiting Anesthetic complications: no    Last Vitals:  Vitals:   02/14/18 2356 02/15/18 0557  BP: (!) 162/98 138/84  Pulse: 71 (!) 57  Resp: 14 14  Temp: 37.1 C 36.8 C  SpO2: 100% 100%    Last Pain:  Vitals:   02/15/18 0557  TempSrc: Oral  PainSc:                  Beryle Lathe

## 2018-02-15 NOTE — Progress Notes (Signed)
Orthopaedic Trauma Service (OTS)  1 Day Post-Op Procedure(s) (LRB): OPEN REDUCTION INTERNAL FIXATION (ORIF) PELVIC FRACTURE (Right) OPEN REDUCTION INTERNAL FIXATION (ORIF) DISTAL RADIUS FRACTURE (Right)  Subjective: Patient reports pain as moderate with right wrist and improved with pelvis.    Objective: Current Vitals Blood pressure 138/84, pulse (!) 57, temperature 98.3 F (36.8 C), temperature source Oral, resp. rate 14, height  (1.702 m), weight 64.7 kg (142 lb 10.2 oz), SpO2 100 %. Vital signs in last 24 hours: Temp:  [97.3 F (36.3 C)-98.8 F (37.1 C)] 98.3 F (36.8 C) (05/29 0557) Pulse Rate:  [57-108] 57 (05/29 0557) Resp:  [11-16] 14 (05/29 0557) BP: (138-171)/(84-106) 138/84 (05/29 0557) SpO2:  [99 %-100 %] 100 % (05/29 0557)  Intake/Output from previous day: 05/28 0701 - 05/29 0700 In: 3265 [P.O.:240; I.V.:2825; IV Piggyback:100] Out: 4035 [Urine:3725; Blood:300; Chest Tube:10]  LABS Recent Labs    02/12/18 2220 02/12/18 2230 02/13/18 0415 02/14/18 1038 02/15/18 0020  HGB 13.5 13.9 12.2* 12.6* 12.3*   Recent Labs    02/14/18 1038 02/15/18 0020  WBC 7.3 13.4*  RBC 4.05* 3.89*  HCT 38.4* 36.2*  PLT 226 279   Recent Labs    02/14/18 1038 02/15/18 0020  NA 134* 135  K 4.1 4.2  CL 103 100*  CO2 26 27  BUN <5* <5*  CREATININE 0.69 0.77  GLUCOSE 96 145*  CALCIUM 8.8* 8.8*   Recent Labs    02/12/18 2220 02/14/18 1038  INR 1.05 0.97     Physical Exam RUE Splint in place  Sens  Ax/R/M/U intact  Mot   Ax/ R/ PIN/ M/ AIN/ U intact  Brisk CR RLE  Dressing intact, clean, dry  Edema/ swelling controlled  Sens: DPN, SPN, TN intact but mild paresthesia with LFCN  Motor: EHL, FHL, and lessor toe ext and flex all intact grossly  Brisk cap refill, warm to touch  Assessment/Plan: 1 Day Post-Op Procedure(s) (LRB): OPEN REDUCTION INTERNAL FIXATION (ORIF) PELVIC FRACTURE (Right) OPEN REDUCTION INTERNAL FIXATION (ORIF) DISTAL RADIUS FRACTURE  (Right) 1. PT/OT (will try to avoid platform on right but unable to mobilize will consider platform and WB thru elbow) 2. DVT proph Other (comment) per trauma service 3. F/u 8-14 days  Myrene Galas, MD [facility] [phone] (p)

## 2018-02-15 NOTE — Progress Notes (Signed)
Received pt from PACU. Alert and oriented x4. S/P ORIF pelvic fx, ORIF distal radius fx and closed reduction and pinning of ulnar styloid. Patient c/o pain to right arm and right hip, PRN dilaudid  given. Noted with CDI compression wrap to RUE, able to wiggle fingers, good cap refill. Also with CDI hydrocolloid dsg to right pelvic area. Chest tube to right chest hooked to suction with no air leak. VSS.Call light within reach.
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
## 2018-02-16 ENCOUNTER — Inpatient Hospital Stay (HOSPITAL_COMMUNITY): Payer: Self-pay

## 2018-02-16 ENCOUNTER — Encounter (HOSPITAL_COMMUNITY): Payer: Self-pay | Admitting: Orthopedic Surgery

## 2018-02-16 DIAGNOSIS — S32301A Unspecified fracture of right ilium, initial encounter for closed fracture: Secondary | ICD-10-CM

## 2018-02-16 DIAGNOSIS — S82831A Other fracture of upper and lower end of right fibula, initial encounter for closed fracture: Secondary | ICD-10-CM | POA: Diagnosis present

## 2018-02-16 DIAGNOSIS — F17200 Nicotine dependence, unspecified, uncomplicated: Secondary | ICD-10-CM | POA: Diagnosis present

## 2018-02-16 DIAGNOSIS — S52501A Unspecified fracture of the lower end of right radius, initial encounter for closed fracture: Secondary | ICD-10-CM

## 2018-02-16 DIAGNOSIS — J939 Pneumothorax, unspecified: Secondary | ICD-10-CM | POA: Diagnosis present

## 2018-02-16 DIAGNOSIS — S52601A Unspecified fracture of lower end of right ulna, initial encounter for closed fracture: Secondary | ICD-10-CM

## 2018-02-16 HISTORY — DX: Unspecified fracture of the lower end of right radius, initial encounter for closed fracture: S52.501A

## 2018-02-16 HISTORY — DX: Unspecified fracture of the lower end of right radius, initial encounter for closed fracture: S52.601A

## 2018-02-16 HISTORY — DX: Unspecified fracture of right ilium, initial encounter for closed fracture: S32.301A

## 2018-02-16 LAB — CBC
HCT: 31.9 % — ABNORMAL LOW (ref 39.0–52.0)
Hemoglobin: 10.5 g/dL — ABNORMAL LOW (ref 13.0–17.0)
MCH: 31.3 pg (ref 26.0–34.0)
MCHC: 32.9 g/dL (ref 30.0–36.0)
MCV: 94.9 fL (ref 78.0–100.0)
PLATELETS: 255 10*3/uL (ref 150–400)
RBC: 3.36 MIL/uL — ABNORMAL LOW (ref 4.22–5.81)
RDW: 12.8 % (ref 11.5–15.5)
WBC: 7.8 10*3/uL (ref 4.0–10.5)

## 2018-02-16 LAB — COMPREHENSIVE METABOLIC PANEL
ALT: 42 U/L (ref 17–63)
AST: 54 U/L — AB (ref 15–41)
Albumin: 3.1 g/dL — ABNORMAL LOW (ref 3.5–5.0)
Alkaline Phosphatase: 52 U/L (ref 38–126)
Anion gap: 10 (ref 5–15)
BUN: 6 mg/dL (ref 6–20)
CHLORIDE: 101 mmol/L (ref 101–111)
CO2: 25 mmol/L (ref 22–32)
CREATININE: 0.71 mg/dL (ref 0.61–1.24)
Calcium: 8.8 mg/dL — ABNORMAL LOW (ref 8.9–10.3)
GFR calc Af Amer: >60 mL/min (ref >60)
Glucose, Bld: 100 mg/dL — ABNORMAL HIGH (ref 65–99)
Potassium: 4.5 mmol/L (ref 3.5–5.1)
SODIUM: 136 mmol/L (ref 135–145)
Total Bilirubin: 0.4 mg/dL (ref 0.3–1.2)
Total Protein: 6.4 g/dL — ABNORMAL LOW (ref 6.5–8.1)

## 2018-02-16 MED ORDER — ACETAMINOPHEN 325 MG PO TABS: 650.0000 mg | ORAL_TABLET | ORAL | Status: AC

## 2018-02-16 MED ORDER — OXYCODONE HCL 5 MG PO TABS: 5.0000 mg | ORAL_TABLET | Freq: Four times a day (QID) | ORAL | 0 refills | Status: AC | PRN

## 2018-02-16 MED ORDER — POLYETHYLENE GLYCOL 3350 17 G PO PACK: 17.0000 g | PACK | Freq: Every day | ORAL | 0 refills | Status: AC | PRN

## 2018-02-16 MED ORDER — DOCUSATE SODIUM 100 MG PO CAPS: 100.0000 mg | ORAL_CAPSULE | Freq: Two times a day (BID) | ORAL | 0 refills | Status: AC

## 2018-02-16 MED ORDER — METHOCARBAMOL 500 MG PO TABS: 500.0000 mg | ORAL_TABLET | Freq: Three times a day (TID) | ORAL | 0 refills | Status: AC

## 2018-02-16 MED ORDER — ENOXAPARIN SODIUM 40 MG/0.4ML ~~LOC~~ SOLN: 40.0000 mg | SUBCUTANEOUS | 0 refills | Status: AC

## 2018-02-16 MED FILL — oxyCODONE HCL 5 MG TABS: 5 | 4 days supply | Qty: 28 | Fill #0

## 2018-02-16 MED FILL — METHOCARBAMOL 500 MG TABS: 500 | 15 days supply | Qty: 45 | Fill #0

## 2018-02-16 MED FILL — ENOXAPARIN 40 MG/0.4 ML SYR: 40 | 30 days supply | Qty: 12 | Fill #0
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
## 2018-02-16 NOTE — Discharge Instructions (Addendum)
Orthopaedic Trauma Service Discharge Instructions   General Discharge Instructions  WEIGHT BEARING STATUS: Weightbearing as tolerated on Right leg, nonweightbearing on Right arm. No lifting with right arm   RANGE OF MOTION/ACTIVITY: unrestricted motion to Right hip, knee and ankle. Unrestricted motion Right elbow and shoulder. No right wrist motion yet as you are in a splint  Wound Care: daily dressing changes to R hip area starting on 02/17/2018. See below. Do not remove splint on R wrist. Do not get splint wet. Call office with issues  Discharge Wound Care Instructions  Do NOT apply any ointments, solutions or lotions to pin sites or surgical wounds.  These prevent needed drainage and even though solutions like hydrogen peroxide kill bacteria, they also damage cells lining the pin sites that help fight infection.  Applying lotions or ointments can keep the wounds moist and can cause them to breakdown and open up as well. This can increase the risk for infection. When in doubt call the office.  Surgical incisions should be dressed daily.  If any drainage is noted, use one layer of adaptic, then gauze and tape.  Alternatively you could use a mepilex type dressing instead (tan bandage that you had on after surgery)  Once the incision is completely dry and without drainage, it may be left open to air out.  Showering may begin 36-48 hours later.  Cleaning gently with soap and water.   DVT/PE prophylaxis: lovenox 40 mg subcutaneous injection daily x 30 days   Diet: as you were eating previously.  Can use over the counter stool softeners and bowel preparations, such as Miralax, to help with bowel movements.  Narcotics can be constipating.  Be sure to drink plenty of fluids  PAIN MEDICATION USE AND EXPECTATIONS  You have likely been given narcotic medications to help control your pain.  After a traumatic event that results in an fracture (broken bone) with or without surgery, it is ok to use  narcotic pain medications to help control one's pain.  We understand that everyone responds to pain differently and each individual patient will be evaluated on a regular basis for the continued need for narcotic medications. Ideally, narcotic medication use should last no more than 6-8 weeks (coinciding with fracture healing).   As a patient it is your responsibility as well to monitor narcotic medication use and report the amount and frequency you use these medications when you come to your office visit.   We would also advise that if you are using narcotic medications, you should take a dose prior to therapy to maximize you participation.  IF YOU ARE ON NARCOTIC MEDICATIONS IT IS NOT PERMISSIBLE TO OPERATE A MOTOR VEHICLE (MOTORCYCLE/CAR/TRUCK/MOPED) OR HEAVY MACHINERY DO NOT MIX NARCOTICS WITH OTHER CNS (CENTRAL NERVOUS SYSTEM) DEPRESSANTS SUCH AS ALCOHOL   STOP SMOKING OR USING NICOTINE PRODUCTS!!!!  As discussed nicotine severely impairs your body's ability to heal surgical and traumatic wounds but also impairs bone healing.  Wounds and bone heal by forming microscopic blood vessels (angiogenesis) and nicotine is a vasoconstrictor (essentially, shrinks blood vessels).  Therefore, if vasoconstriction occurs to these microscopic blood vessels they essentially disappear and are unable to deliver necessary nutrients to the healing tissue.  This is one modifiable factor that you can do to dramatically increase your chances of healing your injury.    (This means no smoking, no nicotine gum, patches, etc)  DO NOT USE NONSTEROIDAL ANTI-INFLAMMATORY DRUGS (NSAID'S)  Using products such as Advil (ibuprofen), Aleve (naproxen), Motrin (ibuprofen)  for additional pain control during fracture healing can delay and/or prevent the healing response.  If you would like to take over the counter (OTC) medication, Tylenol (acetaminophen) is ok.  However, some narcotic medications that are given for pain control contain  acetaminophen as well. Therefore, you should not exceed more than 4000 mg of tylenol in a day if you do not have liver disease.  Also note that there are may OTC medicines, such as cold medicines and allergy medicines that my contain tylenol as well.  If you have any questions about medications and/or interactions please ask your doctor/PA or your pharmacist.      ICE AND ELEVATE INJURED/OPERATIVE EXTREMITY  Using ice and elevating the injured extremity above your heart can help with swelling and pain control.  Icing in a pulsatile fashion, such as 20 minutes on and 20 minutes off, can be followed.    Do not place ice directly on skin. Make sure there is a barrier between to skin and the ice pack.    Using frozen items such as frozen peas works well as the conform nicely to the are that needs to be iced.  USE AN ACE WRAP OR TED HOSE FOR SWELLING CONTROL  In addition to icing and elevation, Ace wraps or TED hose are used to help limit and resolve swelling.  It is recommended to use Ace wraps or TED hose until you are informed to stop.    When using Ace Wraps start the wrapping distally (farthest away from the body) and wrap proximally (closer to the body)   Example: If you had surgery on your leg or thing and you do not have a splint on, start the ace wrap at the toes and work your way up to the thigh        If you had surgery on your upper extremity and do not have a splint on, start the ace wrap at your fingers and work your way up to the upper arm  IF YOU ARE IN A SPLINT OR CAST DO NOT REMOVE IT FOR ANY REASON   If your splint gets wet for any reason please contact the office immediately. You may shower in your splint or cast as long as you keep it dry.  This can be done by wrapping in a cast cover or garbage back (or similar)  Do Not stick any thing down your splint or cast such as pencils, money, or hangers to try and scratch yourself with.  If you feel itchy take benadryl as prescribed on the  bottle for itching  IF YOU ARE IN A CAM BOOT (BLACK BOOT)  You may remove boot periodically. Perform daily dressing changes as noted below.  Wash the liner of the boot regularly and wear a sock when wearing the boot. It is recommended that you sleep in the boot until told otherwise  CALL THE OFFICE WITH ANY QUESTIONS OR CONCERNS: [phone]     1. PAIN CONTROL:  1. Pain is best controlled by a usual combination of three different methods TOGETHER:  1. Ice/Heat 2. Over the counter pain medication 3. Prescription pain medication 2. Most patients will experience some swelling and bruising around wounds. Ice packs or heating pads (30-60 minutes up to 6 times a day) will help. Use ice for the first few days to help decrease swelling and bruising, then switch to heat to help relax tight/sore spots and speed recovery. Some people prefer to use ice alone, heat alone, alternating  between ice & heat. Experiment to what works for you. Swelling and bruising can take several weeks to resolve.  3. It is helpful to take an over-the-counter pain medication regularly for the first few weeks. Choose one of the following that works best for you:  1. Naproxen (Aleve, etc) Two  tabs twice a day 2. Ibuprofen (Advil, etc) Three  tabs four times a day (every meal & bedtime) 3. Acetaminophen (Tylenol, etc) 500-650mg  four times a day (every meal & bedtime) 4. A prescription for pain medication (such as oxycodone, hydrocodone, etc) should be given to you upon discharge. Take your pain medication as prescribed.  1. If you are having problems/concerns with the prescription medicine (does not control pain, nausea, vomiting, rash, itching, etc), please call us [phone] to see if we need to switch you to a different pain medicine that will work better for you and/or control your side effect better. 2. If you need a refill on your pain medication, please contact your pharmacy. They will contact our office to  request authorization. Prescriptions will not be filled after 5 pm or on week-ends. 4. Avoid getting constipated. When taking pain medications, it is common to experience some constipation. Increasing fluid intake and taking a fiber supplement (such as Metamucil, Citrucel, FiberCon, MiraLax, etc) 1-2 times a day regularly will usually help prevent this problem from occurring. A mild laxative (prune juice, Milk of Magnesia, MiraLax, etc) should be taken according to package directions if there are no bowel movements after 48 hours.  5. Watch out for diarrhea. If you have many loose bowel movements, simplify your diet to bland foods & liquids for a few days. Stop any stool softeners and decrease your fiber supplement. Switching to mild anti-diarrheal medications (Kayopectate, Pepto Bismol) can help. If this worsens or does not improve, please call us. 6. Wash / shower every day. Remove your chest tube site dressing on 06/03. No bathing or submerging your wounds in water until they heal. You may shower daily once dressing is removed.  7. FOLLOW UP in our office  1. Please call [facility] at [phone] to confirm your appointment. Please get chest xray prior to coming to your follow up appointment.   WHEN TO CALL us [phone]:  1. Poor pain control 2. Reactions / problems with new medications (rash/itching, nausea, etc)  3. Fever over 101.5 F (38.5 C) 4. Worsening swelling or bruising 5. Continued bleeding from wounds. 6. Increased pain, redness, or drainage from the wounds which could be signs of infection  The clinic staff is available to answer your questions during regular business hours (8:30am-5pm). Please dont hesitate to call and ask to speak to one of our nurses for clinical concerns.  If you have a medical emergency, go to the nearest emergency room or call 911.  A surgeon from [facility] is always on call at the [facility], [place]  [address], [place], [place] [address] ?  MAIN: [phone] ? TOLL FREE: [phone] ?  FAX [phone]  [email or web address]
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
## 2018-02-16 NOTE — Progress Notes (Signed)
Orthopedic Tech Progress Note Patient Details:  Gregory Patterson 08-03-1986 [id number]  Ortho Devices Type of Ortho Device: Ankle Air splint Ortho Device/Splint Location: rle Ortho Device/Splint Interventions: Application   Post Interventions Patient Tolerated: Well Instructions Provided: Care of device ohf cannot be applied because pt does not have the hill rom bed; RN notified Nikki Dom 02/16/2018, 9:04 AM

## 2018-02-16 NOTE — Progress Notes (Signed)
Discharged home today with girlfriend. Discharged instructions, personal belongings,prescriptions given to patient. Verbalized understanding of instructions.
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
## 2024-07-15 ENCOUNTER — Other Ambulatory Visit: Payer: Self-pay | Admitting: Medical Genetics
